# Patient Record
Sex: Female | Born: 1960 | Race: Black or African American | Hispanic: No | Marital: Married | Smoking: Never smoker
Health system: Southern US, Community
[De-identification: ages and names within clinical notes are randomized; demographics above are authoritative.]

## PROBLEM LIST (undated history)

## (undated) DIAGNOSIS — I10 Essential (primary) hypertension: Secondary | ICD-10-CM

## (undated) DIAGNOSIS — E119 Type 2 diabetes mellitus without complications: Secondary | ICD-10-CM

---

## 2021-11-29 ENCOUNTER — Emergency Department (HOSPITAL_BASED_OUTPATIENT_CLINIC_OR_DEPARTMENT_OTHER): Payer: Commercial Managed Care - PPO | Admitting: Radiology

## 2021-11-29 ENCOUNTER — Other Ambulatory Visit: Payer: Self-pay

## 2021-11-29 ENCOUNTER — Encounter (HOSPITAL_BASED_OUTPATIENT_CLINIC_OR_DEPARTMENT_OTHER): Payer: Self-pay

## 2021-11-29 DIAGNOSIS — M65311 Trigger thumb, right thumb: Secondary | ICD-10-CM | POA: Diagnosis not present

## 2021-11-29 DIAGNOSIS — M79644 Pain in right finger(s): Secondary | ICD-10-CM | POA: Diagnosis present

## 2021-11-29 NOTE — ED Triage Notes (Signed)
Patient here POV from Home. ? ?Endorses Right Thumb/Hand Pain that has been present for approximately 2 weeks. Worsening since it began. No Known Acute Trauma/Injury.  ? ?NAD Noted during Triage. A&Ox4. GCS 15. Ambulatory.  ?

## 2021-11-30 ENCOUNTER — Emergency Department (HOSPITAL_BASED_OUTPATIENT_CLINIC_OR_DEPARTMENT_OTHER)
Admission: EM | Admit: 2021-11-30 | Discharge: 2021-11-30 | Disposition: A | Discharge status: Home / Self Care | Payer: Commercial Managed Care - PPO | Attending: Emergency Medicine | Admitting: Emergency Medicine

## 2021-11-30 DIAGNOSIS — M65311 Trigger thumb, right thumb: Secondary | ICD-10-CM

## 2021-11-30 HISTORY — DX: Essential (primary) hypertension: I10

## 2021-11-30 HISTORY — DX: Type 2 diabetes mellitus without complications: E11.9

## 2021-11-30 NOTE — Discharge Instructions (Addendum)
Wear thumb spica splint as applied. ? ?Take ibuprofen 600 mg 3 times daily for the next 5 days. ? ?Follow-up with Dr. Greta Doom in the hand surgery clinic.  His contact information has been provided in this discharge summary for you to call and make these arrangements. ?

## 2021-11-30 NOTE — ED Provider Notes (Signed)
?  [facility] EM[facility] DEPT ?Provider Note ? ? ?CSN: [id number] ?Arrival date & time: 11/29/21  2038 ? ?  ? ?History ? ?Chief Complaint  ?Patient presents with  ? Thumb Pain  ? ? ?Jennifer Meyer is a 61 y.o. female. ? ?Patient is a 61 year old female with no significant past medical history.  She presents today with complaints of right thumb pain.  This has been worsening over the past several weeks and began in the absence of any injury or trauma.  She has episodes where she flexes her thumb, then it locks in place and she has to forcibly extend it and hears a pop.  She denies any aggravating or alleviating factors. ? ?The history is provided by the patient.  ? ?  ? ?Home Medications ?Prior to Admission medications   ?Not on File  ?   ? ?Allergies    ?Patient has no allergy information on record.   ? ?Review of Systems   ?Review of Systems  ?All other systems reviewed and are negative. ? ?Physical Exam ?Updated Vital Signs ?BP 121/88 (BP Location: Left Arm)   Pulse 73   Temp 97.9 ?F (36.6 ?C)   Resp 16   Ht 5\' 6"  (1.676 m)   Wt 81.6 kg   SpO2 100%   BMI 29.05 kg/m?  ?Physical Exam ?Vitals and nursing note reviewed.  ?Constitutional:   ?   Appearance: Normal appearance.  ?HENT:  ?   Head: Normocephalic and atraumatic.  ?Pulmonary:  ?   Effort: Pulmonary effort is normal.  ?Musculoskeletal:  ?   Comments: The right thumb appears grossly normal.  Patient does have episodes where it locks when flexed and requires slight force to fully extend.  ?Skin: ?   General: Skin is warm and dry.  ?Neurological:  ?   Mental Status: She is alert and oriented to person, place, and time.  ? ? ?ED Results / Procedures / Treatments   ?Labs ?(all labs ordered are listed, but only abnormal results are displayed) ?Labs Reviewed - No data to display ? ?EKG ?None ? ?Radiology ?DG Hand Complete Right ? ?Result Date: 11/29/2021 ?CLINICAL DATA:  Atraumatic right hand pain x2 weeks. EXAM: RIGHT HAND - COMPLETE 3+ VIEW  COMPARISON:  None Available. FINDINGS: There is no evidence of fracture or dislocation. There is no evidence of arthropathy or other focal bone abnormality. Soft tissues are unremarkable. IMPRESSION: Negative. Electronically Signed   By: 01/29/2022 M.D.   On: 11/29/2021 21:11   ? ?Procedures ?Procedures  ? ? ?Medications Ordered in ED ?Medications - No data to display ? ?ED Course/ Medical Decision Making/ A&P ? ?Patient has what appears to be a trigger thumb.  She will be placed in a thumb spica splint, advised to take anti-inflammatories, and follow-up with hand surgery for consideration of repair. ? ?Final Clinical Impression(s) / ED Diagnoses ?Final diagnoses:  ?None  ? ? ?Rx / DC Orders ?ED Discharge Orders   ? ? None  ? ?  ? ? ?  ?01/29/2022, MD ?11/30/21 0123 ? ?

## 2022-01-09 LAB — CMP (EXT)
ALT/SGPT (EXT): 12 U/L (ref 7–33)
AST/SGOT (EXT): 15 U/L (ref 9–32)
Albumin (EXT): 4.5 g/dL (ref 3.3–5.0)
Alkaline Phosphatase (EXT): 80 U/L (ref 30–100)
Anion Gap (EXT): 10 mmol/L (ref 3–17)
BUN (EXT): 11 mg/dL (ref 8–25)
Bilirubin, Total (EXT): 0.3 mg/dL (ref 0.0–1.0)
CO2 (EXT): 26 mmol/L (ref 23–32)
CalciumCalcium (EXT): 9.2 mg/dL (ref 8.5–10.5)
Chloride (EXT): 103 mmol/L (ref 98–108)
Creatinine (EXT): 0.68 mg/dL (ref 0.60–1.50)
GFR Estimated (Calc) (EXT): 99 mL/min/{1.73_m2} (ref >59)
Globulin (EXT): 3 g/dL (ref 1.9–4.1)
Glucose (EXT): 111 mg/dL — ABNORMAL HIGH (ref 70–110)
Potassium (EXT): 3.8 mmol/L (ref 3.4–5.0)
Protein (EXT): 7.5 g/dL (ref 6.0–8.3)
Sodium (EXT): 139 mmol/L (ref 135–145)

## 2022-03-21 NOTE — Telephone Encounter (Signed)
Hello, patient is moving up from [place] this Saturday. Patient was recently seen by her PCP there and they discovered blood in her urine and reccommend they see a specialist up here. Patient will also be changing insurances, but currently has [employer]. Patient scheduled for first available on 04/25/22 with Dr. Gwenith DailyHaas, but should be seen within 7-10 days. Patient can best be reached at [phone] to schedule sooner. Thank you!

## 2022-04-25 ENCOUNTER — Ambulatory Visit
Admit: 2022-04-25 | Discharge: 2022-04-25 | Payer: PRIVATE HEALTH INSURANCE | Attending: Student in an Organized Health Care Education/Training Program | Primary: Internal Medicine

## 2022-04-25 DIAGNOSIS — R3129 Other microscopic hematuria: Secondary | ICD-10-CM

## 2022-04-25 LAB — POCT URINALYSIS DIPSTICK
Bilirubin, UA, POC: NEGATIVE
Glucose, UA, POC: NORMAL
Ketones, UA, POC: NEGATIVE
Leukocytes, UA, POC: NEGATIVE
Nitrite, UA, POC: NEGATIVE
Protein, UA, POC: NEGATIVE
Spec Gravity, UA, POC: <=1.005
Urobilinogen, UA, POC: NORMAL mg/dL
pH, UA, POC: 7

## 2022-04-25 LAB — URINE MICROSCOPIC ONLY
Bacteria, Ur: NEGATIVE
Hyaline Casts, Ur: 0.3 /LPF (ref 0–8)
RBC, Ur: 3 {cells}/[HPF] (ref 0–4)
Squamous Epithelial Cells, Ur: NEGATIVE /HPF
WBC, Ur: <1 {cells}/[HPF] (ref 0–5)

## 2022-04-25 NOTE — Progress Notes (Signed)
[name]  [facility]  [address]  [place] [address]  Dept: [phone]  Dept Fax: [phone]    UROLOGY CLINIC NOTE    Chief complaint:   1. Microhematuria    History of present illness:   This is a 61 y.o. female with a medical history including but not limited to anxiety, depressive disorder, diabetes mellitus, HLD, polyarthritis, HTN, and urinary incontinence, who is here for evaluation of microhematuria.    1. Microhematuria: Since 09/2020*. Found on routine screening U/A done by PCP (Dr. Mliss Holland - [facility] [place], [place]).    Denies any smoking history.  No family history of GU malignancies.  No h/o stones or recurrent UTIs.  Denies bothersome LUTS.  Brother with kidney stones.    Urinalysis (U/A)  10/19/2020: 1+ (Blood), 0-5 (WBC/HPF), 3-10 (RBC/HPF) - [facility].  03/17/2022: 1+ (Blood), 0-5 (WBC/HPF), None (RBC/HPF) - [facility].    The patient has no urgent complaints.   Denies gross hematuria, urethral discharge, pyuria, dysuria, fever, chills, and weight loss.    Patient Active Problem List   Diagnosis   . Anxiety   . Chest pain   . Depressive disorder   . Diabetes mellitus (CMS/HCC)   . Headache(784.0)   . Other hyperlipidemia   . Positive PPD   . Primary hypertension   . Tuberculosis   . Left knee pain   . Right knee pain   . Generalized anxiety disorder   . Nontoxic goiter   . Metabolic syndrome   . Herpes simplex viral infection   . Urinary incontinence   . Polyarthritis   . Pain in right hip   . 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 Flatulence      No family history on file.   No past surgical history on file.  Social History     Tobacco Use   . Smoking status: Never   . Smokeless tobacco: Never   Substance Use Topics   . Alcohol use: Never   . Drug use: None       Home Medications:    Current Outpatient Medications:   .  acetaminophen (Tylenol) 500 mg tablet, Take by mouth every 6 (six) hours if needed for pain  score 1-3., Disp: , Rfl:   .  lisinopril 10 mg tablet, lisinopril 10 mg tablet  TAKE 1/2 TO 1 TABLET BY MOUTH ONCE Holland FOR HYPERTENSION, Disp: , Rfl:   .  metFORMIN (Glucophage) 500 mg tablet, TAKE 1 TABLET (500 MG) BY MOUTH ONCE Holland AFTER SUPPER FOR PREDIABETES AND INSULIN RESISTANCE, Disp: , Rfl:   .  simvastatin (Zocor) 20 mg tablet, simvastatin 20 mg tablet  TAKE 1 TABLET BY MOUTH IN THE EVENING, Disp: , Rfl:     Allergies:  No Known Allergies     ROS:   Constitutional: No fevers, chills, weight loss or general weakness.   HENT: No headache or dizziness, otherwise negative.  Eyes: No loss of vision or double vision.   Lungs: No shortness of breath. No new, frequent cough.   Cardiovascular: No chest pain or palpitations.   Gastrointestinal: No abdominal pain, nausea, or vomiting  Genitourinary: See HPI.  Musculoskeletal: No joint/muscle pain or swelling.  Skin: Negative for rash  Endocrine: Negative   Hematologic: No easy bruising or bleeding  Neurological: No seizures, light headedness, or numbness  Psychiatry: No mood or behavioral changes.    Objective:  Vitals:    04/25/22  1549   BP: 123/78   Pulse: 67       Physical Exam:  General Appearance: Well-developed, well nourished, alert and cooperative, NAD  HEENT: Normo-cephalic, atraumatic, EOMI, anicteric, vision grossly intact, no nasal discharge.   Skin: Normal color, texture, turgor, with no lesions, eruptions, rashes, bruises or petechiae.  Neck: Supple, no masses. Normal ROM. Trachea is in midline.   Chest: Normal AP diameter. No rib tenderness.  Lungs: Normal respiratory effort, symmetric excursion with no accessory muscle use.  Back: Vertebral column aligned, no masses or tenderness. No CVAT b/l  Cardiovascular: regular rate, no varicosities  Abdomen: Soft, benign, non-tender, non-distended, no palpable masses.  Bladder non-palpable  Extremities: No clubbing, cyanosis or pitting edema.     Musculo-Skeletal: No muscle wasting, joint swelling or  tenderness.  Lymphatic: No cervical or inguinal adenopathy.   Neurologic: Oriented x3 with no focal deficits, normal gait.    Results: I have reviewed and interpreted the labs below:    Urine Culture:  03/18/2022: <10,000 CFU /mL Bacteria - [facility].    BUN, Cr, GFR:  04/20/2021: 10, 0.68, 100 - [facility].  03/17/2022: 12, 0.83, 80 - [facility].    Urinalysis (U/A)  10/19/2020: 1+ (Blood), 0-5 (WBC/HPF), 3-10 (RBC/HPF) - [facility].  03/17/2022: 1+ (Blood), 0-5 (WBC/HPF), None (RBC/HPF) - [facility].    Imaging (Reviewed by Dr. Gwenith Holland):    Assessment/Plan:  This is a 60 y.o. female with the following urologic problems:    1. Microhematuria: 3-10 RBC/HPF previously with 1+ blood on dip today.  No risk factors for GU malignancy.    Plan:  - RUS  - UA microscopic  - RTC 3 mo for cystoscopy.    IDerald Macleod, MD, spent a total of 40 minutes (this includes time spent in preparation of the note) with the patient Kelsey Holland reviewing the medical history, labs and imaging for management, treatment, counseling and coordination of care.    Kelsey Holland, M.D.  Urology

## 2022-05-18 DIAGNOSIS — R3129 Other microscopic hematuria: Secondary | ICD-10-CM

## 2022-05-19 LAB — CMP (EXT)
ALT/SGPT (EXT): 11 U/L (ref 2–40)
AST/SGOT (EXT): 13 U/L (ref 2–35)
Albumin (EXT): 4.2 g/dL (ref 3.2–5.1)
Alkaline Phosphatase (EXT): 53 U/L (ref 38–125)
BUN (EXT): 16 mg/dL (ref 7–25)
Bilirubin, Total (EXT): 0.4 mg/dL (ref 0.1–1.3)
CO2 (EXT): 32 mmol/L (ref 21–33)
CalciumCalcium (EXT): 9.7 mg/dL (ref 8.8–10.6)
Chloride (EXT): 103 mmol/L (ref 98–110)
Creatinine (EXT): 0.93 mg/dL (ref 0.50–1.20)
Glucose (EXT): 90 mg/dL (ref 65–139)
Potassium (EXT): 4 mmol/L (ref 3.3–5.3)
Protein (EXT): 7 g/dL (ref 6.0–8.9)
Sodium (EXT): 140 mmol/L (ref 134–146)
eGFR - Creat CKD-EPI (EXT): >60 (ref >60)

## 2022-05-19 LAB — LIPID PROFILE (EXT)
Chol/HDL Ratio (EXT): 3.1 (ref <=4.9)
Cholesterol (EXT): 131 mg/dL (ref <=199)
HDL Cholesterol (EXT): 42 mg/dL (ref >=41)
LDL Cholesterol, CALC (EXT): 66.6 mg/dL (ref <=130)
Triglycerides (EXT): 112 mg/dL (ref <=149)

## 2022-05-19 LAB — HEPATITIS C ANTIBODY (EXT): HEPATITIS C ANTIBODY (EXT): 0.05 {index_val} (ref <0.80)

## 2022-07-04 ENCOUNTER — Inpatient Hospital Stay: Admit: 2022-07-04 | Payer: PRIVATE HEALTH INSURANCE | Primary: Internal Medicine

## 2022-07-04 DIAGNOSIS — R3129 Other microscopic hematuria: Secondary | ICD-10-CM

## 2022-07-12 ENCOUNTER — Ambulatory Visit
Payer: PRIVATE HEALTH INSURANCE | Attending: Student in an Organized Health Care Education/Training Program | Primary: Internal Medicine

## 2022-08-09 ENCOUNTER — Ambulatory Visit
Admit: 2022-08-09 | Payer: PRIVATE HEALTH INSURANCE | Attending: Student in an Organized Health Care Education/Training Program | Primary: Internal Medicine

## 2022-08-09 DIAGNOSIS — R3129 Other microscopic hematuria: Secondary | ICD-10-CM

## 2022-08-09 LAB — POCT URINALYSIS DIPSTICK
Bilirubin, UA, POC: NEGATIVE
Glucose, UA, POC: NORMAL
Ketones, UA, POC: NEGATIVE
Leukocytes, UA, POC: NEGATIVE
Nitrite, UA, POC: NEGATIVE
Protein, UA, POC: NEGATIVE
Spec Gravity, UA, POC: <=1.005 (ref 1.001–1.035)
Urobilinogen, UA, POC: NORMAL
pH, UA, POC: 7 (ref 5.0–8.0)

## 2022-08-09 NOTE — Progress Notes (Signed)
[facility]  [address]  [place] [address]  Dept: [phone]  Dept Fax: [phone]    UROLOGY CLINIC NOTE    Chief complaint:   1. Microhematuria    History of present illness:   This is a 62 y.o. female with a medical history including but not limited to anxiety, depressive disorder, diabetes mellitus, HLD, polyarthritis, HTN, and urinary incontinence, who is here for evaluation of microhematuria.    She is here today for a cystoscopy.    1. Microhematuria: Since 09/2020. Found on routine screening U/A done by PCP (Dr. Dario Guardian - [facility] [place], [place]). Renal and Bladder Ultrasound on 07/04/2022 was unremarkable.    Denies any smoking history.  No family history of GU malignancies.  No h/o stones or recurrent UTIs.  Denies bothersome LUTS.  Brother with kidney stones.    Urinalysis (U/A)  10/19/2020: 1+ Blood, 3-10 RBC - [facility].  03/17/2022: 1+ Blood - [facility].    Urinalysis (Microscopic):  04/25/2022: 3 RBC.    Urine Dipstick:  04/25/2022: 1+ Blood.    BUN, Cr, GFR:  04/20/2021: 10, 0.68, 100 - [facility].  03/17/2022: 12, 0.83, 80 - [facility].  05/18/2022: 16, 0.93, >60. - [facility]    Urine Culture:  03/18/2022: <10,000 CFU/mL unspecified type  - [facility].  05/18/2022: 10,000-100,000 CFU/mL mixed gram positive organisms. - [facility]    The patient has no urgent complaints.   Denies gross hematuria, urethral discharge, pyuria, dysuria, fever, chills, and weight loss.    Patient Active Problem List   Diagnosis   . Anxiety   . Chest pain   . Depressive disorder   . Diabetes mellitus (CMS/HCC)   . Headache(784.0)   . Other hyperlipidemia   . Positive PPD   . Primary hypertension   . Tuberculosis   . Left knee pain   . Right knee pain   . Generalized anxiety disorder   . Nontoxic goiter   . Metabolic syndrome   . Herpes simplex viral infection   . Urinary incontinence   . Polyarthritis   . Pain in right hip   . Asymptomatic varicose veins of bilateral  lower extremities   . Flatulence   . Microhematuria   . Polyp of colon      No family history on file.   History reviewed. No pertinent surgical history.  Social History     Tobacco Use   . Smoking status: Never   . Smokeless tobacco: Never   Substance Use Topics   . Alcohol use: Yes   . Drug use: None       Home Medications:    Current Outpatient Medications:   .  acetaminophen (Tylenol) 500 mg tablet, Take by mouth every 6 (six) hours if needed for pain score 1-3., Disp: , Rfl:   .  lisinopril 10 mg tablet, lisinopril 10 mg tablet  TAKE 1/2 TO 1 TABLET BY MOUTH ONCE DAILY FOR HYPERTENSION, Disp: , Rfl:   .  metFORMIN (Glucophage) 500 mg tablet, TAKE 1 TABLET (500 MG) BY MOUTH ONCE DAILY AFTER SUPPER FOR PREDIABETES AND INSULIN RESISTANCE, Disp: , Rfl:   .  simvastatin (Zocor) 20 mg tablet, simvastatin 20 mg tablet  TAKE 1 TABLET BY MOUTH IN THE EVENING, Disp: , Rfl:     Allergies:  No Known Allergies     ROS:   Constitutional: No fevers, chills, weight loss or general weakness.   HENT: No headache or dizziness,  otherwise negative.  Eyes: No loss of vision or double vision.   Lungs: No shortness of breath. No new, frequent cough.   Cardiovascular: No chest pain or palpitations.   Gastrointestinal: No abdominal pain, nausea, or vomiting  Genitourinary: See HPI.  Musculoskeletal: No joint/muscle pain or swelling.  Skin: Negative for rash  Endocrine: Negative   Hematologic: No easy bruising or bleeding  Neurological: No seizures, light headedness, or numbness  Psychiatry: No mood or behavioral changes.    Objective:  Vitals:    08/09/22 1442   BP: 109/72   Pulse: 83   Temp: 36.6 C (97.9 F)       Physical Exam:  General Appearance: Well-developed, well nourished, alert and cooperative, NAD  HEENT: Normo-cephalic, atraumatic, EOMI, anicteric, vision grossly intact, no nasal discharge.   Skin: Normal color, texture, turgor, with no lesions, eruptions, rashes, bruises or petechiae.  Neck: Supple, no masses. Normal ROM.  Trachea is in midline.   Chest: Normal AP diameter. No rib tenderness.  Lungs: Normal respiratory effort, symmetric excursion with no accessory muscle use.  Back: Vertebral column aligned, no masses or tenderness. No CVAT b/l  Cardiovascular: regular rate, no varicosities  Abdomen: Soft, benign, non-tender, non-distended, no palpable masses.  Bladder non-palpable  Extremities: No clubbing, cyanosis or pitting edema.     Musculo-Skeletal: No muscle wasting, joint swelling or tenderness.  Lymphatic: No cervical or inguinal adenopathy.   Neurologic: Oriented x3 with no focal deficits, normal gait.    Results: I have reviewed and interpreted the labs below:    Urinalysis (U/A)  10/19/2020: 1+ Blood, 3-10 RBC - [facility].  03/17/2022: 1+ Blood - [facility].    Urinalysis (Microscopic):  04/25/2022: 3 RBC.    Urine Dipstick:  04/25/2022: 1+ Blood.    BUN, Cr, GFR:  04/20/2021: 10, 0.68, 100 - [facility].  03/17/2022: 12, 0.83, 80 - [facility].  05/18/2022: 16, 0.93, >60. - [facility]    Urine Culture:  03/18/2022: <10,000 CFU/mL unspecified type  - [facility].  05/18/2022: 10,000-100,000 CFU/mL mixed gram positive organisms. - [facility]    Imaging (Reviewed by Dr. Gwenith Daily):    Renal and Bladder Ultrasound:  07/04/2022: Unremarkable renal ultrasound. PVR = 14.3 mL.    Assessment/Plan:  This is a 62 y.o. female with the following urologic problems:    1. Microhematuria: 3-10 RBC/HPF previously with 1+ blood on dip today.  No risk factors for GU malignancy.    Plan:  - Cystoscopy today wnl.  Negative microscopic hematuria workup with normal RUS and cysto -> benign essential microscopic hematuria.    RTC PRN    I, Derald Macleod, MD, spent a total of 20 minutes (this includes time spent in preparation of the note) with the patient Kelsey Holland reviewing the medical history, labs and imaging for management, treatment, counseling and coordination of care.    Derald Macleod, M.D.  Urology    Procedures?  PROCEDURE  PERFORMED:?Cystoscopy  PREOPERATIVE DIAGNOSIS:?Hematuria  POSTOPERATIVE DIAGNOSIS: No evidence of disease.  ATTENDING SURGEON:?Cephus Tupy Gwenith Daily, MD  ASSISTANT:?Ashley Alphia Kava, PA-C  ANESTHESIA:?Topical 2% Lidocaine gel.?   INDICATIONS:?See HPI.   DESCRIPTION OF THE PROCEDURE: After obtaining consent from the patient, she was taken to the Cystoscopy suite and placed on the table in the dorsal lithotomy position. She was prepped and draped in usual sterile fashion. Timeout was performed to identify the patient and procedure. 10 ml of 2% Lidocaine gel was introduced into the urethra. After allowing approximately 5 minutes for the anesthesia to take effect, a  36fr flexible cystoscope was introduced into the urethra and advanced under direct vision.  The bladder was systematically examined with the below findings:  URETHRA:?Normal.?   BLADDER NECK:?Open  BLADDER: WNL  URETERAL ORIFICES:?Normal position  COMPLICATIONS:?None.?   DRAINS:? None.?   SPECIMENS:? None.  EBL:? None.?   DISPOSITION:?Patient tolerated the procedure well, was counseled on the above findings and the left the clinic in satisfactory condition.

## 2022-12-17 IMAGING — DX DG HAND COMPLETE 3+V*R*
3 series · 3 of 3 positions shown · non-contrast
Comparison: None Available.

CLINICAL DATA: Atraumatic right hand pain x2 weeks.

EXAM:
RIGHT HAND - COMPLETE 3+ VIEW

[hand ap]
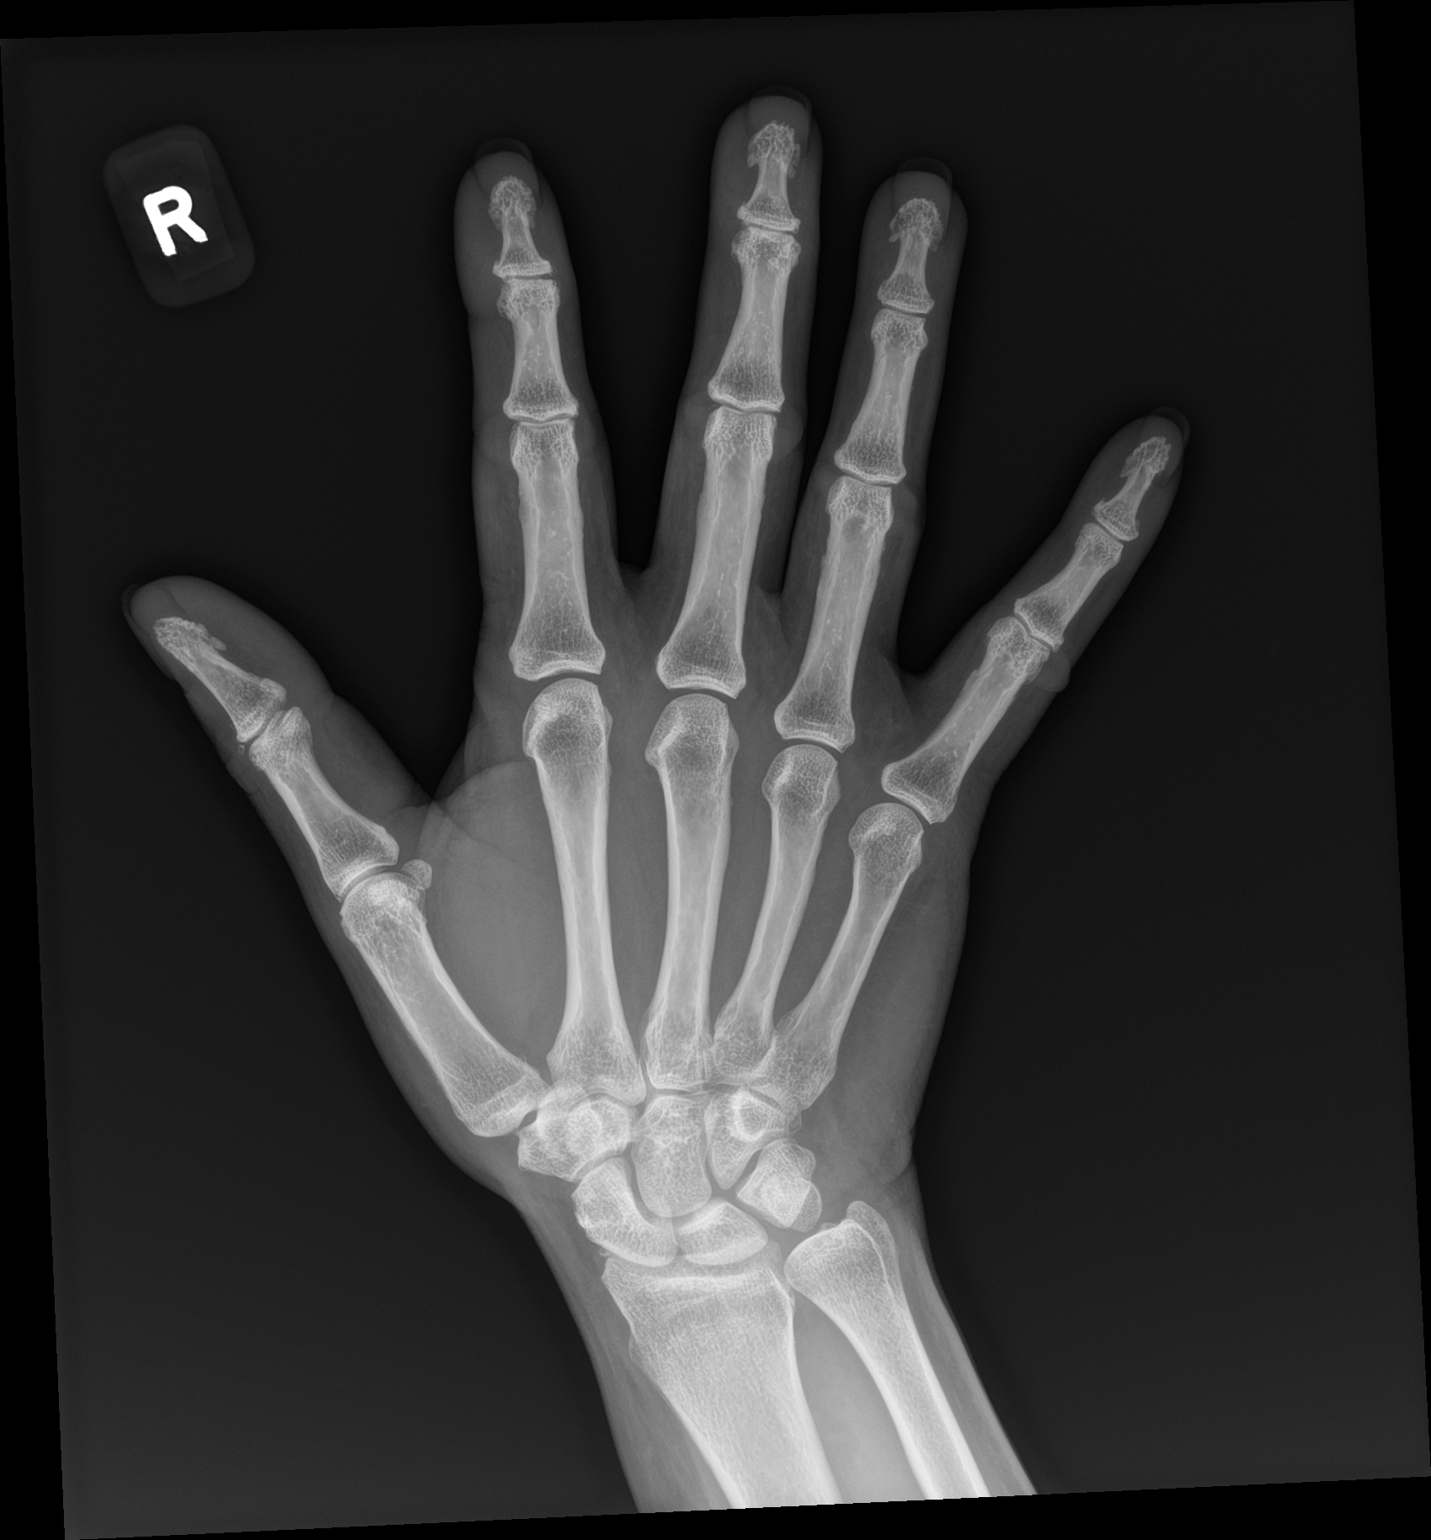

[hand obl]
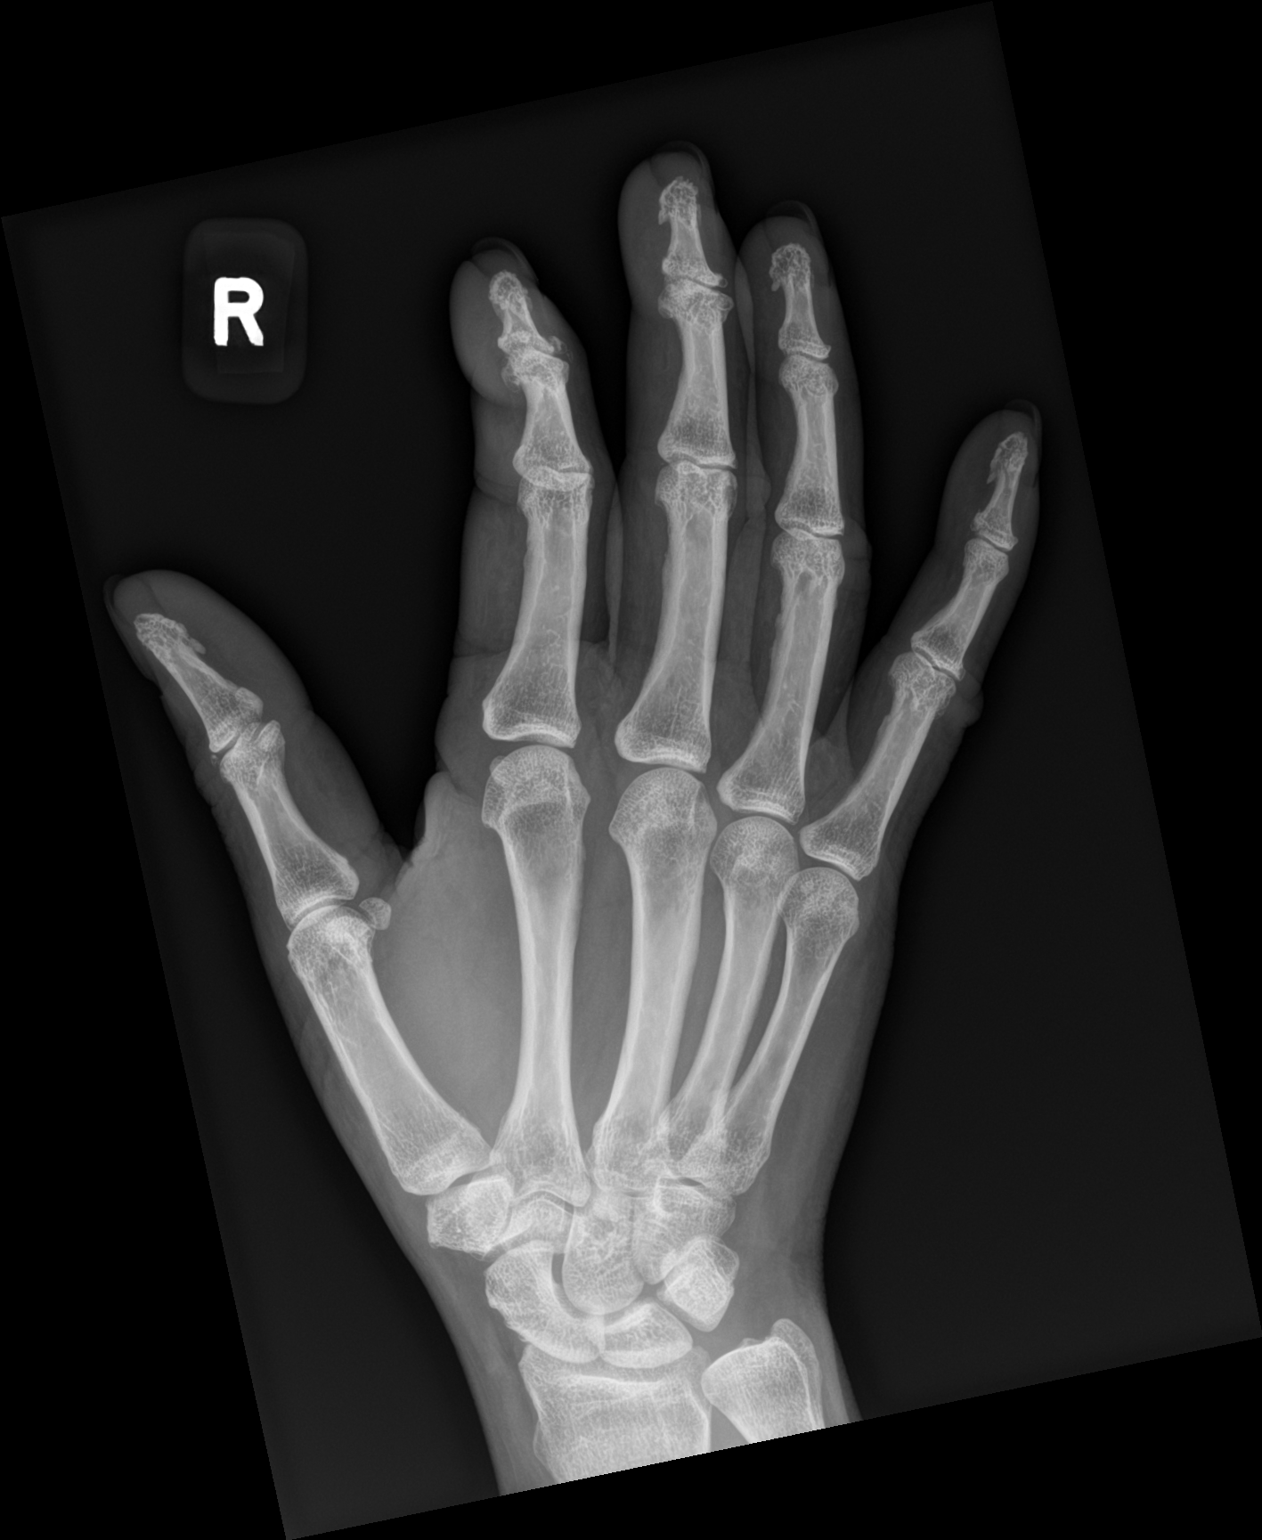

[hand lat]
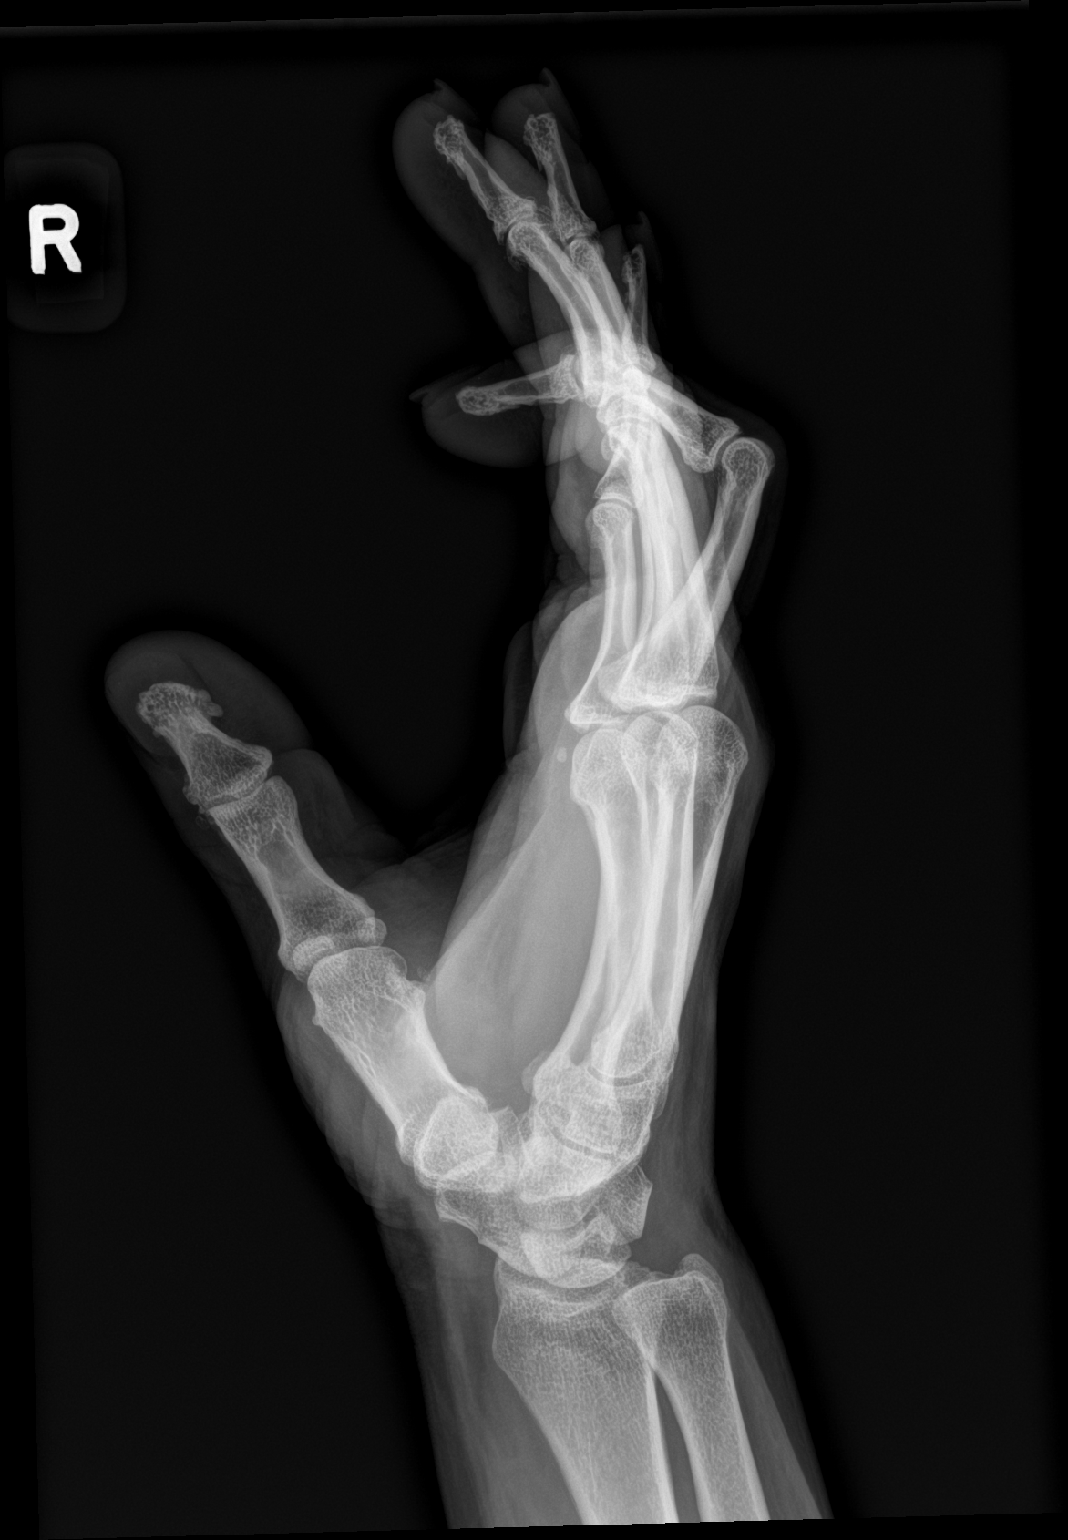

[3 of 3 positions shown; findings below may reference images not displayed]

FINDINGS: There is no evidence of fracture or dislocation. There is no
evidence of arthropathy or other focal bone abnormality. Soft
tissues are unremarkable.
IMPRESSION: Negative.
